# Patient Record
Sex: Male | Born: 1970 | Marital: Married | Smoking: Former smoker
Health system: Southern US, Community
[De-identification: ages and names within clinical notes are randomized; demographics above are authoritative.]

## PROBLEM LIST (undated history)

## (undated) DIAGNOSIS — F419 Anxiety disorder, unspecified: Secondary | ICD-10-CM

## (undated) DIAGNOSIS — E119 Type 2 diabetes mellitus without complications: Secondary | ICD-10-CM

## (undated) DIAGNOSIS — E785 Hyperlipidemia, unspecified: Secondary | ICD-10-CM

## (undated) HISTORY — DX: Anxiety disorder, unspecified: F41.9

## (undated) HISTORY — PX: TONSILLECTOMY: SUR1361

## (undated) HISTORY — DX: Hyperlipidemia, unspecified: E78.5

---

## 2016-11-12 ENCOUNTER — Ambulatory Visit (INDEPENDENT_AMBULATORY_CARE_PROVIDER_SITE_OTHER): Payer: BLUE CROSS/BLUE SHIELD | Admitting: Podiatry

## 2016-11-12 DIAGNOSIS — L03032 Cellulitis of left toe: Secondary | ICD-10-CM | POA: Diagnosis not present

## 2016-11-13 NOTE — Progress Notes (Signed)
Subjective:    Patient ID: Harry Murphy, male   DOB: 46 y.o.   MRN: [id number]   HPI patient presents with inflamed left big toe and states she's had problems with this over time but the worst has been over the last month    Review of Systems  All other systems reviewed and are negative.       Objective:  Physical Exam  Constitutional: He appears well-developed and well-nourished.  Cardiovascular: Intact distal pulses.   Musculoskeletal: Normal range of motion.  Neurological: He is alert.  Skin: Skin is warm.  Nursing note and vitals reviewed.  neurovascular status found to be intact muscle strength was adequate range of motion within normal limits with patient's left hallux noted to have an inflamed area lateral border that's painful when pressed making shoe gear difficult. Patient is distal redness no active drainage and no proximal edema erythema or drainage noted     Assessment:    Paronychia infection of the left hallux     Plan:    H&P condition reviewed and recommended that we removed the offending corner and allowed drainage to occur. I explained procedure and infiltrated left hallux 60 minutes I can Marcaine mixture remove the lateral border removed all proud flesh abscess tissue allowed channel for drainage and applied sterile dressing. Reappoint to recheck as needed and may require permanent procedure

## 2017-09-04 DIAGNOSIS — Z0000 Encounter for general adult medical examination without abnormal findings: Secondary | ICD-10-CM | POA: Diagnosis not present

## 2017-09-04 DIAGNOSIS — Z125 Encounter for screening for malignant neoplasm of prostate: Secondary | ICD-10-CM | POA: Diagnosis not present

## 2017-09-04 DIAGNOSIS — E7800 Pure hypercholesterolemia, unspecified: Secondary | ICD-10-CM | POA: Diagnosis not present

## 2017-09-04 DIAGNOSIS — R739 Hyperglycemia, unspecified: Secondary | ICD-10-CM | POA: Diagnosis not present

## 2017-09-11 DIAGNOSIS — E7800 Pure hypercholesterolemia, unspecified: Secondary | ICD-10-CM | POA: Diagnosis not present

## 2017-09-11 DIAGNOSIS — R739 Hyperglycemia, unspecified: Secondary | ICD-10-CM | POA: Diagnosis not present

## 2017-11-30 DIAGNOSIS — E7800 Pure hypercholesterolemia, unspecified: Secondary | ICD-10-CM | POA: Diagnosis not present

## 2017-11-30 DIAGNOSIS — S60562A Insect bite (nonvenomous) of left hand, initial encounter: Secondary | ICD-10-CM | POA: Diagnosis not present

## 2017-11-30 DIAGNOSIS — R739 Hyperglycemia, unspecified: Secondary | ICD-10-CM | POA: Diagnosis not present

## 2018-04-28 DIAGNOSIS — E119 Type 2 diabetes mellitus without complications: Secondary | ICD-10-CM | POA: Diagnosis not present

## 2018-09-15 DIAGNOSIS — E119 Type 2 diabetes mellitus without complications: Secondary | ICD-10-CM | POA: Diagnosis not present

## 2018-09-24 DIAGNOSIS — E7800 Pure hypercholesterolemia, unspecified: Secondary | ICD-10-CM | POA: Diagnosis not present

## 2018-09-24 DIAGNOSIS — Z7189 Other specified counseling: Secondary | ICD-10-CM | POA: Diagnosis not present

## 2018-09-24 DIAGNOSIS — E119 Type 2 diabetes mellitus without complications: Secondary | ICD-10-CM | POA: Diagnosis not present

## 2019-01-06 DIAGNOSIS — R739 Hyperglycemia, unspecified: Secondary | ICD-10-CM | POA: Diagnosis not present

## 2019-01-06 DIAGNOSIS — E7800 Pure hypercholesterolemia, unspecified: Secondary | ICD-10-CM | POA: Diagnosis not present

## 2019-01-06 DIAGNOSIS — Z125 Encounter for screening for malignant neoplasm of prostate: Secondary | ICD-10-CM | POA: Diagnosis not present

## 2019-01-06 DIAGNOSIS — E119 Type 2 diabetes mellitus without complications: Secondary | ICD-10-CM | POA: Diagnosis not present

## 2019-01-14 DIAGNOSIS — Z23 Encounter for immunization: Secondary | ICD-10-CM | POA: Diagnosis not present

## 2019-01-14 DIAGNOSIS — E119 Type 2 diabetes mellitus without complications: Secondary | ICD-10-CM | POA: Diagnosis not present

## 2019-01-14 DIAGNOSIS — Z0000 Encounter for general adult medical examination without abnormal findings: Secondary | ICD-10-CM | POA: Diagnosis not present

## 2019-01-14 DIAGNOSIS — E7800 Pure hypercholesterolemia, unspecified: Secondary | ICD-10-CM | POA: Diagnosis not present

## 2019-05-23 ENCOUNTER — Ambulatory Visit (INDEPENDENT_AMBULATORY_CARE_PROVIDER_SITE_OTHER): Payer: 59

## 2019-05-23 ENCOUNTER — Other Ambulatory Visit: Payer: Self-pay

## 2019-05-23 ENCOUNTER — Ambulatory Visit
Admission: EM | Admit: 2019-05-23 | Discharge: 2019-05-23 | Disposition: A | Discharge status: Home / Self Care | Payer: 59 | Attending: Family Medicine | Admitting: Family Medicine

## 2019-05-23 DIAGNOSIS — M79645 Pain in left finger(s): Secondary | ICD-10-CM | POA: Diagnosis not present

## 2019-05-23 DIAGNOSIS — S61211A Laceration without foreign body of left index finger without damage to nail, initial encounter: Secondary | ICD-10-CM

## 2019-05-23 DIAGNOSIS — W260XXA Contact with knife, initial encounter: Secondary | ICD-10-CM

## 2019-05-23 DIAGNOSIS — Z23 Encounter for immunization: Secondary | ICD-10-CM

## 2019-05-23 HISTORY — DX: Type 2 diabetes mellitus without complications: E11.9

## 2019-05-23 MED ORDER — LIDOCAINE-EPINEPHRINE-TETRACAINE (LET) TOPICAL GEL
3.0000 mL | Freq: Once | TOPICAL | Status: AC
  Administered 2019-05-23: 3 mL via TOPICAL

## 2019-05-23 MED ORDER — TETANUS-DIPHTH-ACELL PERTUSSIS 5-2.5-18.5 LF-MCG/0.5 IM SUSP
0.5000 mL | Freq: Once | INTRAMUSCULAR | Status: AC
  Administered 2019-05-23: 0.5 mL via INTRAMUSCULAR

## 2019-05-23 MED ORDER — AMOXICILLIN 500 MG PO CAPS: 500.0000 mg | ORAL_CAPSULE | Freq: Three times a day (TID) | ORAL | 0 refills | Status: DC

## 2019-05-23 NOTE — ED Triage Notes (Signed)
Pt was trying to cut tape with a hunting knife and it went into his left hand index finger

## 2019-05-23 NOTE — Discharge Instructions (Signed)
Routine wound care Follow up in 7-10 days for suture removal or sooner if any problems

## 2019-05-23 NOTE — ED Provider Notes (Signed)
[facility]    CSN: [id number] Arrival date & time: 05/23/19  1745      History   Chief Complaint Chief Complaint  Patient presents with  . Extremity Laceration    HPI Harry Murphy is a 49 y.o. male.   49 yo male with a c/o laceration to his left index finger about an hour ago with a hunting knife while trying to cut some tape.      Past Medical History:  Diagnosis Date  . Diabetes mellitus without complication ([place])     There are no problems to display for this patient.   Past Surgical History:  Procedure Laterality Date  . TONSILLECTOMY         Home Medications    Prior to Admission medications   Medication Sig Start Date End Date Taking? Authorizing Provider  amoxicillin (AMOXIL) 500 MG capsule Take 1 capsule (500 mg total) by mouth 3 (three) times daily. 05/23/19   Norval Gable, MD  DULoxetine (CYMBALTA) 60 MG capsule Take 60 mg by mouth daily. 02/23/19   [provider]  JANUVIA 100 MG tablet Take 100 mg by mouth daily. 02/10/19   [provider]  metFORMIN (GLUCOPHAGE-XR) 500 MG 24 hr tablet Take 1,000 mg by mouth 2 (two) times daily. 02/03/19   [provider]  OZEMPIC, 0.25 OR 0.5 MG/DOSE, 2 MG/1.5ML SOPN  04/29/19   [provider]  pioglitazone (ACTOS) 15 MG tablet Take 15 mg by mouth daily. 04/06/19   [provider]  propranolol (INDERAL) 10 MG tablet  04/12/19   [provider]  simvastatin (ZOCOR) 5 MG tablet Take 5 mg by mouth at bedtime. 04/29/19   [provider]    Family History History reviewed. No pertinent family history.  Social History Social History   Tobacco Use  . Smoking status: Never Smoker  . Smokeless tobacco: Former Network engineer Use Topics  . Alcohol use: Yes    Comment: social  . Drug use: Yes    Types: Marijuana     Allergies   Patient has no known allergies.   Review of Systems Review of Systems   Physical Exam Triage Vital  Signs ED Triage Vitals  Enc Vitals Group     BP 05/23/19 1758 120/74     Pulse Rate 05/23/19 1758 64     Resp 05/23/19 1758 18     Temp 05/23/19 1758 98 F (36.7 C)     Temp Source 05/23/19 1758 Oral     SpO2 05/23/19 1758 99 %     Weight 05/23/19 1757 285 lb (129.3 kg)     Height 05/23/19 1757 5\' 10"  (1.778 m)     Head Circumference --      Peak Flow --      Pain Score 05/23/19 1757 0     Pain Loc --      Pain Edu? --      Excl. in [place]? --    No data found.  Updated Vital Signs BP 120/74 (BP Location: Right Arm)   Pulse 64   Temp 98 F (36.7 C) (Oral)   Resp 18   Ht 5\' 10"  (1.778 m)   Wt 129.3 kg   SpO2 99%   BMI 40.89 kg/m   Visual Acuity Right Eye Distance:   Left Eye Distance:   Bilateral Distance:    Right Eye Near:   Left Eye Near:    Bilateral Near:     Physical Exam  Vitals and nursing note reviewed.  Constitutional:      General: He is not in acute distress.    Appearance: He is not toxic-appearing or diaphoretic.  Musculoskeletal:     Left hand: Laceration (2cm proximal lateral aspect; normal range of motion and strength; no apparent tendon involvement) and tenderness present. No swelling or deformity. Normal range of motion. Normal strength. Normal sensation. There is no disruption of two-point discrimination. Normal capillary refill. Normal pulse.  Neurological:     Mental Status: He is alert.      UC Treatments / Results  Labs (all labs ordered are listed, but only abnormal results are displayed) Labs Reviewed - No data to display  EKG   Radiology DG Finger Index Left  Result Date: 05/23/2019 CLINICAL DATA:  Laceration left index finger with a knife while trying to cut tape today. Initial encounter. EXAM: LEFT INDEX FINGER 2+V COMPARISON:  None. FINDINGS: There is no evidence of fracture or dislocation. There is no evidence of arthropathy or other focal bone abnormality. Soft tissues are unremarkable. No radiopaque foreign body. Bandaging  about the proximal aspect of the index finger noted. IMPRESSION: Negative for fracture or foreign body. Electronically Signed   By: Inge Rise M.D.   On: 05/23/2019 18:57    Procedures Laceration Repair  Date/Time: 05/23/2019 8:18 PM Performed by: Norval Gable, MD Authorized by: Norval Gable, MD   Consent:    Consent obtained:  Verbal   Consent given by:  Patient   Risks discussed:  Infection, need for additional repair, nerve damage, poor wound healing, poor cosmetic result, pain, retained foreign body, tendon damage and vascular damage   Alternatives discussed:  No treatment Anesthesia (see MAR for exact dosages):    Anesthesia method:  Topical application and local infiltration   Topical anesthetic:  LET   Local anesthetic:  Lidocaine 1% w/o epi Laceration details:    Location:  Finger   Finger location:  L index finger   Length (cm):  2 Repair type:    Repair type:  Simple Pre-procedure details:    Preparation:  Patient was prepped and draped in usual sterile fashion and imaging obtained to evaluate for foreign bodies Exploration:    Hemostasis achieved with:  Direct pressure and LET   Wound exploration: wound explored through full range of motion and entire depth of wound probed and visualized     Wound extent: areolar tissue violated     Contaminated: no   Treatment:    Area cleansed with:  Hibiclens and Betadine   Amount of cleaning:  Standard   Irrigation solution:  Sterile water   Irrigation method:  Syringe   Foreign body removal: no visualized foreign bodies.   Skin repair:    Repair method:  Sutures and Steri-Strips   Suture size:  5-0   Suture material:  Nylon   Suture technique:  Simple interrupted   Number of sutures:  6   Number of Steri-Strips:  3 Approximation:    Approximation:  Close Post-procedure details:    Dressing:  Antibiotic ointment, non-adherent dressing and splint for protection   Patient tolerance of procedure:  Tolerated well,  no immediate complications   (including critical care time)  Medications Ordered in UC Medications  Tdap (BOOSTRIX) injection 0.5 mL (0.5 mLs Intramuscular Given 05/23/19 1805)  lidocaine-EPINEPHrine-tetracaine (LET) topical gel (3 mLs Topical Given 05/23/19 1817)    Initial Impression / Assessment and Plan / UC Course  I have reviewed the triage vital  signs and the nursing notes.  Pertinent labs & imaging results that were available during my care of the patient were reviewed by me and considered in my medical decision making (see chart for details).     Final Clinical Impressions(s) / UC Diagnoses   Final diagnoses:  Laceration of left index finger without foreign body without damage to nail, initial encounter     Discharge Instructions     Routine wound care Follow up in 7-10 days for suture removal or sooner if any problems    ED Prescriptions    Medication Sig Dispense Auth. Provider   amoxicillin (AMOXIL) 500 MG capsule Take 1 capsule (500 mg total) by mouth 3 (three) times daily. 21 capsule Norval Gable, MD      1. x-ray results and diagnosis reviewed with patient; Tdap given 2. rx as per orders above; reviewed possible side effects, interactions, risks and benefits; prophylactic rx due to diabetes (risk) 3. Recommend supportive treatment with routine wound care; information 4. Follow-up in 7-10 days for suture removal or sooner prn if any problems   PDMP not reviewed this encounter.   Norval Gable, MD 05/23/19 2025

## 2019-06-01 ENCOUNTER — Ambulatory Visit
Admission: EM | Admit: 2019-06-01 | Discharge: 2019-06-01 | Disposition: A | Discharge status: Home / Self Care | Payer: 59

## 2019-06-01 DIAGNOSIS — Z4802 Encounter for removal of sutures: Secondary | ICD-10-CM

## 2019-06-01 NOTE — ED Triage Notes (Signed)
Patient here for suture removal. Had 6 sutures placed to left pointer finger.

## 2019-10-21 ENCOUNTER — Other Ambulatory Visit: Payer: Self-pay

## 2019-10-21 ENCOUNTER — Ambulatory Visit (AMBULATORY_SURGERY_CENTER): Payer: Self-pay | Admitting: *Deleted

## 2019-10-21 ENCOUNTER — Encounter: Payer: Self-pay | Admitting: Gastroenterology

## 2019-10-21 VITALS — Ht 70.0 in | Wt 284.0 lb

## 2019-10-21 DIAGNOSIS — Z1211 Encounter for screening for malignant neoplasm of colon: Secondary | ICD-10-CM

## 2019-10-21 MED ORDER — SUTAB 1479-225-188 MG PO TABS: 1.0000 | ORAL_TABLET | ORAL | 0 refills | Status: DC

## 2019-10-21 NOTE — Progress Notes (Signed)
Patient is here in-person for PV. Patient denies any allergies to eggs or soy. Patient denies any problems with anesthesia/sedation. Patient denies any oxygen use at home. Patient denies taking any diet/weight loss medications or blood thinners. Patient is not being treated for MRSA or C-diff. Patient is aware of our care-partner policy and FUWTK-18 safety protocol.COVID-19 vaccines completed on 07/27/19 per pt. Prep Prescription coupon given to the patient.

## 2019-11-04 ENCOUNTER — Ambulatory Visit (AMBULATORY_SURGERY_CENTER): Payer: No Typology Code available for payment source | Admitting: Gastroenterology

## 2019-11-04 ENCOUNTER — Encounter: Payer: Self-pay | Admitting: Gastroenterology

## 2019-11-04 ENCOUNTER — Other Ambulatory Visit: Payer: Self-pay

## 2019-11-04 VITALS — BP 115/70 | HR 58 | Temp 97.7°F | Resp 12 | Ht 70.0 in | Wt 284.0 lb

## 2019-11-04 DIAGNOSIS — D123 Benign neoplasm of transverse colon: Secondary | ICD-10-CM

## 2019-11-04 DIAGNOSIS — D122 Benign neoplasm of ascending colon: Secondary | ICD-10-CM

## 2019-11-04 DIAGNOSIS — Z1211 Encounter for screening for malignant neoplasm of colon: Secondary | ICD-10-CM

## 2019-11-04 MED ORDER — SODIUM CHLORIDE 0.9 % IV SOLN: 500.0000 mL | Freq: Once | INTRAVENOUS | Status: DC

## 2019-11-04 NOTE — Patient Instructions (Signed)

## 2019-11-04 NOTE — Progress Notes (Signed)
Called to room to assist during endoscopic procedure.  Patient ID and intended procedure confirmed with present staff. Received instructions for my participation in the procedure from the performing physician.  

## 2019-11-04 NOTE — Progress Notes (Signed)
Report to PACU, RN, vss, BBS= Clear.  

## 2019-11-04 NOTE — Progress Notes (Signed)
Pt's states no medical or surgical changes since previsit or office visit. VS by CW. 

## 2019-11-04 NOTE — Op Note (Signed)
[place] Patient Name: Harry Murphy Procedure Date: 11/04/2019 9:14 AM MRN: [id number] Endoscopist: Ladene Artist , MD Age: 49 Referring MD:  Date of Birth: 06/20/1970 Gender: Male Account #: [id number] Procedure:                Colonoscopy Indications:              Screening for colorectal malignant neoplasm Medicines:                Monitored Anesthesia Care Procedure:                Pre-Anesthesia Assessment:                           - Prior to the procedure, a History and Physical                            was performed, and patient medications and                            allergies were reviewed. The patient's tolerance of                            previous anesthesia was also reviewed. The risks                            and benefits of the procedure and the sedation                            options and risks were discussed with the patient.                            All questions were answered, and informed consent                            was obtained. Prior Anticoagulants: The patient has                            taken no previous anticoagulant or antiplatelet                            agents. ASA Grade Assessment: III - A patient with                            severe systemic disease. After reviewing the risks                            and benefits, the patient was deemed in                            satisfactory condition to undergo the procedure.                           After obtaining informed consent, the colonoscope  was passed under direct vision. Throughout the                            procedure, the patient's blood pressure, pulse, and                            oxygen saturations were monitored continuously. The                            Colonoscope was introduced through the anus and                            advanced to the the cecum, identified by                            appendiceal orifice and  ileocecal valve. The                            ileocecal valve, appendiceal orifice, and rectum                            were photographed. The quality of the bowel                            preparation was good. The colonoscopy was performed                            without difficulty. The patient tolerated the                            procedure well. Scope In: 9:21:15 AM Scope Out: 9:37:37 AM Scope Withdrawal Time: 0 hours 12 minutes 47 seconds  Total Procedure Duration: 0 hours 16 minutes 22 seconds  Findings:                 The perianal and digital rectal examinations were                            normal.                           Two sessile polyps were found in the transverse                            colon and ascending colon. The polyps were 6 to 8                            mm in size. These polyps were removed with a cold                            snare. Resection and retrieval were complete.                           A few small-mouthed diverticula were found in the  left colon.                           Internal hemorrhoids were found during                            retroflexion. The hemorrhoids were small and Grade                            I (internal hemorrhoids that do not prolapse).                           The exam was otherwise without abnormality on                            direct and retroflexion views. Complications:            No immediate complications. Estimated blood loss:                            None. Estimated Blood Loss:     Estimated blood loss: none. Impression:               - Two 6 to 8 mm polyps in the transverse colon and                            in the ascending colon, removed with a cold snare.                            Resected and retrieved.                           - Mild diverticulosis in the left colon.                           - Internal hemorrhoids.                           - The  examination was otherwise normal on direct                            and retroflexion views. Recommendation:           - Repeat colonoscopy date to be determined after                            pending pathology results are reviewed for                            surveillance based on pathology results.                           - Patient has a contact number available for                            emergencies. The signs and symptoms of potential  delayed complications were discussed with the                            patient. Return to normal activities tomorrow.                            Written discharge instructions were provided to the                            patient.                           - High fiber diet.                           - Continue present medications.                           - Await pathology results. Ladene Artist, MD 11/04/2019 9:42:27 AM This report has been signed electronically.

## 2019-11-08 ENCOUNTER — Telehealth: Payer: Self-pay | Admitting: *Deleted

## 2019-11-08 ENCOUNTER — Telehealth: Payer: Self-pay

## 2019-11-08 NOTE — Telephone Encounter (Signed)
  Follow up Call-  Call back number 11/04/2019  Post procedure Call Back phone  # [phone]  Permission to leave phone message Yes  Some recent data might be hidden     No answer at 2nd attempt follow up phone call.  Left message on voicemail.

## 2019-11-08 NOTE — Telephone Encounter (Signed)
LVM

## 2019-11-13 ENCOUNTER — Encounter: Payer: Self-pay | Admitting: Gastroenterology

## 2020-07-17 IMAGING — CR DG FINGER INDEX 2+V*L*
3 series · 4 of 4 positions shown · non-contrast
Comparison: None.

CLINICAL DATA: Laceration left index finger with a knife while
trying to cut tape today. Initial encounter.

EXAM:
LEFT INDEX FINGER 2+V

[finger ap]
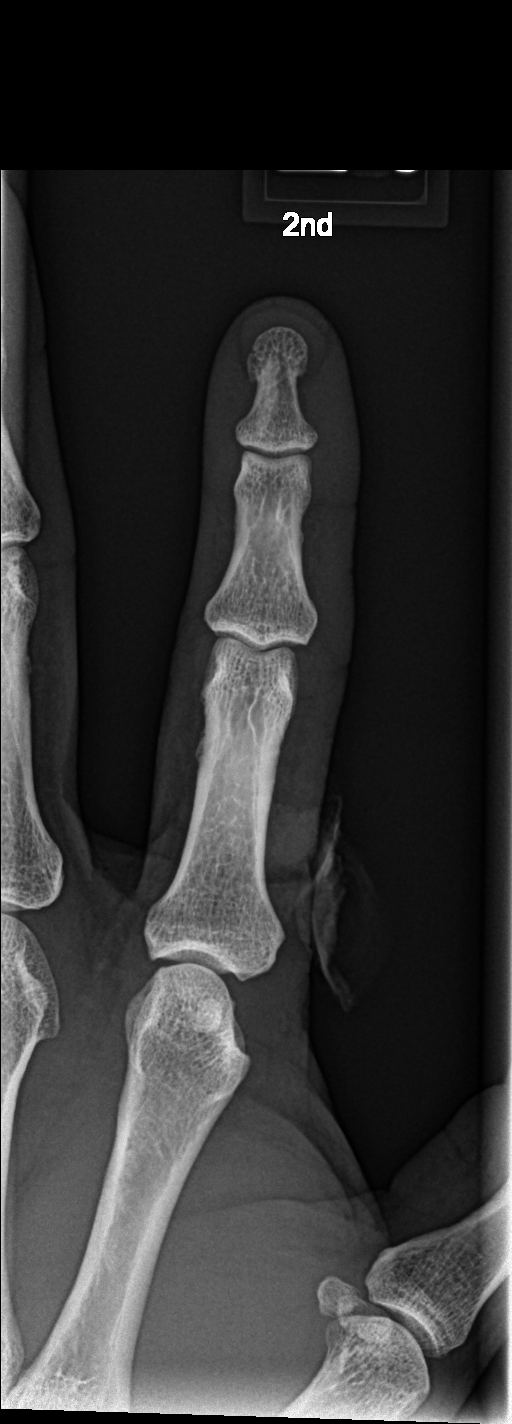

[finger obl]
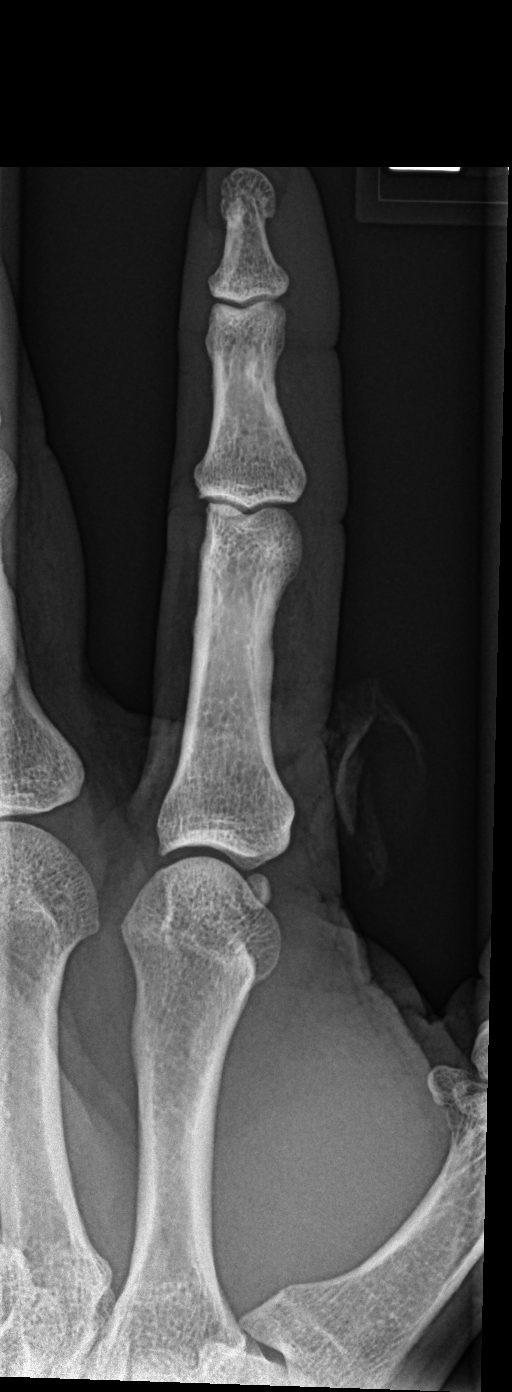

[Series 3: finger lat · 0.14mm/px · 2 of 2 slices shown]
[im 1/2]
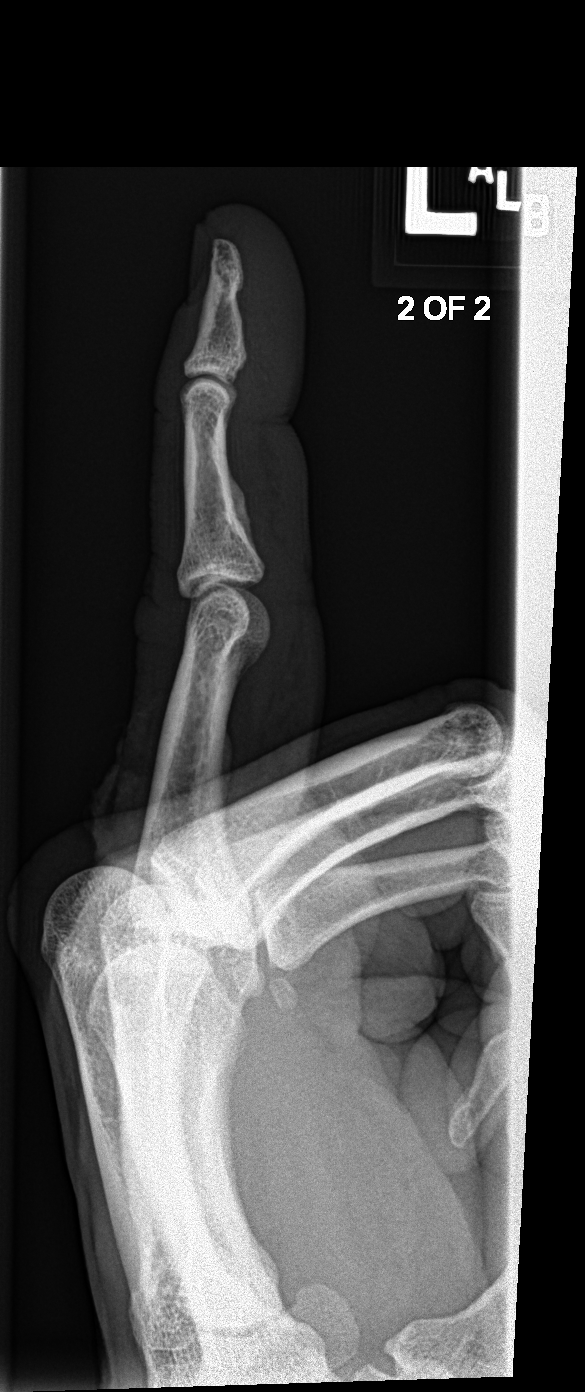
[im 2/2]
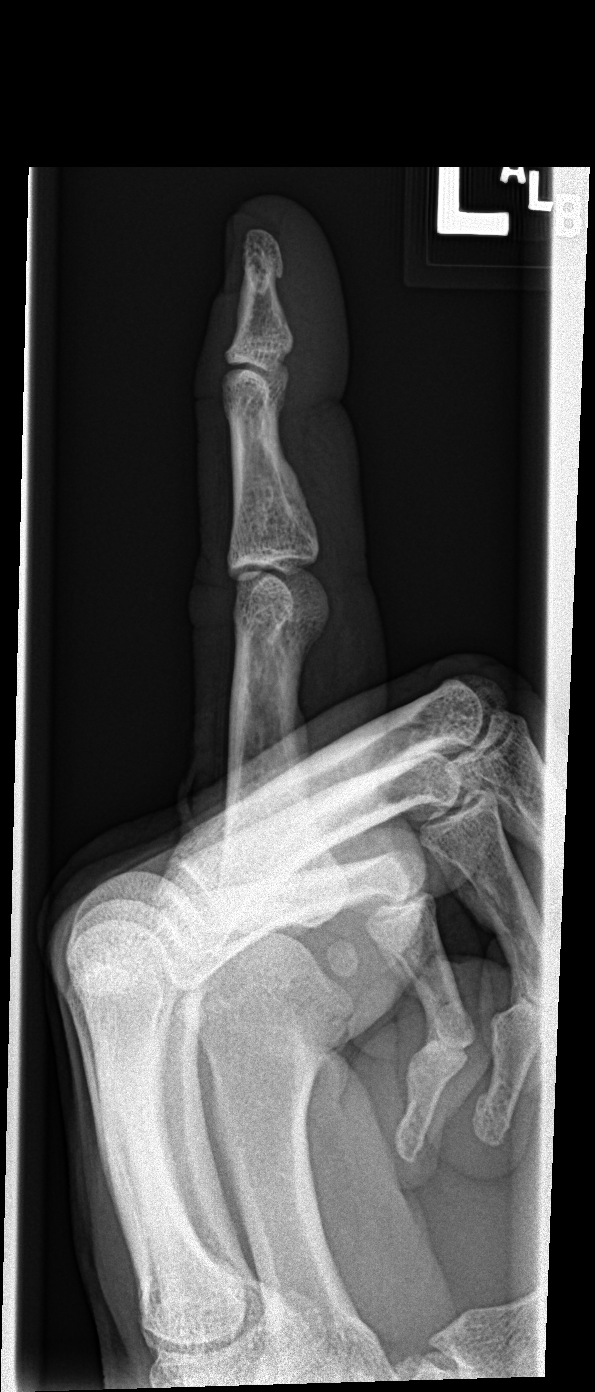

[4 of 4 positions shown; findings below may reference images not displayed]

FINDINGS: There is no evidence of fracture or dislocation. There is no
evidence of arthropathy or other focal bone abnormality. Soft
tissues are unremarkable. No radiopaque foreign body. Bandaging
about the proximal aspect of the index finger noted.
IMPRESSION: Negative for fracture or foreign body.

## 2021-03-06 ENCOUNTER — Other Ambulatory Visit (HOSPITAL_COMMUNITY): Payer: Self-pay

## 2021-03-06 MED ORDER — OZEMPIC (0.25 OR 0.5 MG/DOSE) 2 MG/1.5ML ~~LOC~~ SOPN
PEN_INJECTOR | SUBCUTANEOUS | 5 refills | Status: DC
  Filled 2021-03-06 – 2021-03-08 (×2): qty 1.5, 28d supply, fill #0
  Filled 2021-04-02: qty 1.5, 28d supply, fill #1

## 2021-03-08 ENCOUNTER — Other Ambulatory Visit (HOSPITAL_COMMUNITY): Payer: Self-pay

## 2021-04-02 ENCOUNTER — Other Ambulatory Visit (HOSPITAL_COMMUNITY): Payer: Self-pay

## 2022-01-01 ENCOUNTER — Other Ambulatory Visit (HOSPITAL_COMMUNITY): Payer: Self-pay

## 2022-04-30 ENCOUNTER — Encounter: Payer: Self-pay | Admitting: Internal Medicine

## 2022-04-30 ENCOUNTER — Ambulatory Visit: Payer: No Typology Code available for payment source | Admitting: Internal Medicine

## 2022-04-30 VITALS — BP 143/89 | HR 67 | Ht 70.0 in | Wt 270.6 lb

## 2022-04-30 DIAGNOSIS — R002 Palpitations: Secondary | ICD-10-CM

## 2022-04-30 DIAGNOSIS — I471 Supraventricular tachycardia, unspecified: Secondary | ICD-10-CM

## 2022-04-30 DIAGNOSIS — I10 Essential (primary) hypertension: Secondary | ICD-10-CM

## 2022-04-30 DIAGNOSIS — E782 Mixed hyperlipidemia: Secondary | ICD-10-CM

## 2022-04-30 MED ORDER — PROPRANOLOL HCL 10 MG PO TABS: 10.0000 mg | ORAL_TABLET | Freq: Three times a day (TID) | ORAL | 6 refills | Status: AC | PRN

## 2022-04-30 MED ORDER — LISINOPRIL 5 MG PO TABS: 5.0000 mg | ORAL_TABLET | Freq: Every day | ORAL | 3 refills | Status: AC

## 2022-04-30 NOTE — Progress Notes (Signed)
Primary Physician/Referring:  Harry Seashore, MD  Patient ID: Harry Murphy, male    DOB: 17-Sep-1970, 52 y.o.   MRN: [id number]  Chief Complaint  Patient presents with   Palpitations   New Patient (Initial Visit)        HPI:    Harry Murphy  is a 52 y.o. male with past medical history significant for hypertension, hyperlipidemia, and diabetes who is here to establish care with cardiology due to palpitations.  Patient saw his primary care physician a few months ago and mentioned he was having frequent palpitations and at that time an event monitor was ordered.  He did follow back up with his PCP who told him it was no significant finding on his event monitor.  Patient does still experience palpitations.  Of note when he exercises and even at max exertion he does not have any chest pain or shortness of breath.  Patient is fairly active and he also never experiences palpitations with activity.  Usually his palpitations just come on randomly but the propranolol has been helping.  Patient denies diaphoresis, syncope, edema, orthopnea, PND, claudication.  Past Medical History:  Diagnosis Date   Anxiety    Diabetes mellitus without complication ([place])    Type 2   Hyperlipidemia    Past Surgical History:  Procedure Laterality Date   TONSILLECTOMY     Family History  Problem Relation Age of Onset   Diabetes type I Mother    Stroke Father    Diabetes Mellitus II Father    Stroke Sister    Pancreatic cancer Maternal Grandmother    Colon cancer Neg Hx    Colon polyps Neg Hx    Esophageal cancer Neg Hx    Rectal cancer Neg Hx    Stomach cancer Neg Hx     Social History   Tobacco Use   Smoking status: Former    Packs/day: 1.00    Years: 10.00    Total pack years: 10.00    Types: Cigarettes    Quit date: 1998    Years since quitting: 26.0   Smokeless tobacco: Former    Quit date: 2010  Substance Use Topics   Alcohol use: Yes    Alcohol/week: 3.0 standard drinks of alcohol     Types: 3 Standard drinks or equivalent per week    Comment: social-occ   Marital Status: Married  ROS  Review of Systems  Cardiovascular:  Positive for palpitations.   Objective  Blood pressure (!) 143/89, pulse 67, height '5\' 10"'$  (1.778 m), weight 270 lb 9.6 oz (122.7 kg), SpO2 100 %. Body mass index is 38.83 kg/m.     04/30/2022    3:08 PM 11/04/2019   10:00 AM 11/04/2019    9:57 AM  Vitals with BMI  Height '5\' 10"'$     Weight 270 lbs 10 oz    BMI 83.66    Systolic 294 765 465  Diastolic 89 70 70  Pulse 67 58      Physical Exam Vitals reviewed.  HENT:     Head: Normocephalic and atraumatic.  Cardiovascular:     Rate and Rhythm: Normal rate and regular rhythm.     Pulses: Normal pulses.     Heart sounds: Normal heart sounds. No murmur heard. Pulmonary:     Effort: Pulmonary effort is normal.     Breath sounds: Normal breath sounds.  Abdominal:     General: Bowel sounds are normal.  Musculoskeletal:     Right lower  leg: No edema.     Left lower leg: No edema.  Skin:    General: Skin is warm and dry.  Neurological:     Mental Status: He is alert.     Medications and allergies  No Known Allergies   Medication list after today's encounter   Current Outpatient Medications:    DULoxetine (CYMBALTA) 60 MG capsule, Take 60 mg by mouth daily., Disp: , Rfl:    lisinopril (ZESTRIL) 5 MG tablet, Take 1 tablet (5 mg total) by mouth at bedtime., Disp: 90 tablet, Rfl: 3   OZEMPIC, 1 MG/DOSE, 4 MG/3ML SOPN, Inject into the skin., Disp: , Rfl:    propranolol (INDERAL) 10 MG tablet, Take 1 tablet (10 mg total) by mouth 3 (three) times daily as needed., Disp: 90 tablet, Rfl: 6   rosuvastatin (CRESTOR) 10 MG tablet, Take 10 mg by mouth daily., Disp: , Rfl:    XIGDUO XR 08-998 MG TB24, Take 1 tablet by mouth daily., Disp: , Rfl:   Laboratory examination:   No results found for: "NA", "K", "CO2", "GLUCOSE", "BUN", "CREATININE", "CALCIUM", "EGFR", "GFRNONAA"      No data to  display             No data to display          Lipid Panel No results for input(s): "CHOL", "TRIG", "LDLCALC", "VLDL", "HDL", "CHOLHDL", "LDLDIRECT" in the last 8760 hours.  HEMOGLOBIN A1C No results found for: "HGBA1C", "MPG" TSH No results for input(s): "TSH" in the last 8760 hours.  External labs:   Labs 12/10/2021: Hemoglobin 16.1 hematocrit 48.4 platelets 208 Glucose 150 BUN 15 creatinine 0.96 potassium 5.1 AST 56 ALT 85 Hemoglobin A1c 7.4 PSA 0.4 TSH 1.32 Total cholesterol 127 triglycerides 145 HDL 43 LDL 59  Radiology:    Cardiac Studies:   Zio patch worn for 14 days from 12/25/2021 to 01/08/2022 Predominant rhythm was normal sinus rhythm Max heart rate 172 minimum 46 and average heart rate of 71 Very few runs of SVT noted, less than 1%  EKG:   04/30/2022: Normal sinus rhythm, normal axis, normal R wave progression.  No ischemia  Assessment     ICD-10-CM   1. Palpitations  R00.2 EKG 12-Lead    PCV ECHOCARDIOGRAM COMPLETE    Basic metabolic panel    CBC    Hgb A1c w/o eAG    Lipid Panel With LDL/HDL Ratio    PSA    TSH    VITAMIN D 25 Hydroxy (Vit-D Deficiency, Fractures)    2. SVT (supraventricular tachycardia)  I47.10 PCV ECHOCARDIOGRAM COMPLETE    Basic metabolic panel    CBC    Hgb A1c w/o eAG    Lipid Panel With LDL/HDL Ratio    PSA    TSH    VITAMIN D 25 Hydroxy (Vit-D Deficiency, Fractures)    3. Essential hypertension  I10 PCV ECHOCARDIOGRAM COMPLETE    Basic metabolic panel    CBC    Hgb A1c w/o eAG    Lipid Panel With LDL/HDL Ratio    PSA    TSH    VITAMIN D 25 Hydroxy (Vit-D Deficiency, Fractures)    4. Mixed hyperlipidemia  E78.2 PCV ECHOCARDIOGRAM COMPLETE    Basic metabolic panel    CBC    Hgb A1c w/o eAG    Lipid Panel With LDL/HDL Ratio    PSA    TSH    VITAMIN D 25 Hydroxy (Vit-D Deficiency, Fractures)  Orders Placed This Encounter  Procedures   Basic metabolic panel   CBC   Hgb A1c w/o eAG   Lipid Panel  With LDL/HDL Ratio   PSA   TSH   VITAMIN D 25 Hydroxy (Vit-D Deficiency, Fractures)   EKG 12-Lead   PCV ECHOCARDIOGRAM COMPLETE    Standing Status:   Future    Standing Expiration Date:   05/01/2023    Meds ordered this encounter  Medications   propranolol (INDERAL) 10 MG tablet    Sig: Take 1 tablet (10 mg total) by mouth 3 (three) times daily as needed.    Dispense:  90 tablet    Refill:  6   lisinopril (ZESTRIL) 5 MG tablet    Sig: Take 1 tablet (5 mg total) by mouth at bedtime.    Dispense:  90 tablet    Refill:  3    Medications Discontinued During This Encounter  Medication Reason   OZEMPIC, 0.25 OR 0.5 MG/DOSE, 2 MG/1.5ML SOPN Completed Course   pioglitazone (ACTOS) 15 MG tablet Completed Course   Semaglutide,0.25 or 0.'5MG'$ /DOS, (OZEMPIC, 0.25 OR 0.5 MG/DOSE,) 2 MG/1.5ML SOPN Completed Course   simvastatin (ZOCOR) 5 MG tablet Completed Course   metFORMIN (GLUCOPHAGE-XR) 500 MG 24 hr tablet Change in therapy   propranolol (INDERAL) 10 MG tablet      Recommendations:   Harry Murphy is a 52 y.o.  male with palpitations  Palpitations Will increase propranolol 10 mg to 3 times daily as needed   SVT (supraventricular tachycardia) Event monitor worn, see results above Echocardiogram ordered   Essential hypertension Will send lisinopril 5 mg to be taken nightly I would like repeat BMP in approximately 1 week however patient has follow-up with his primary care doctor first week of March and he will be getting repeat labs then.  I will go ahead and order all of the repeat labs for him. Encourage low-sodium diet, less than 2000 mg daily. Follow-up in 1-2 months or sooner if needed.   Mixed hyperlipidemia Continue Crestor, lipids are very well-controlled     Harry Flock, DO, [facility]  04/30/2022, 4:22 PM Office: [phone] Pager: [phone]

## 2022-05-07 ENCOUNTER — Ambulatory Visit: Payer: No Typology Code available for payment source

## 2022-05-07 DIAGNOSIS — I10 Essential (primary) hypertension: Secondary | ICD-10-CM

## 2022-05-07 DIAGNOSIS — R002 Palpitations: Secondary | ICD-10-CM

## 2022-05-07 DIAGNOSIS — I471 Supraventricular tachycardia, unspecified: Secondary | ICD-10-CM

## 2022-05-07 DIAGNOSIS — E782 Mixed hyperlipidemia: Secondary | ICD-10-CM

## 2022-05-11 NOTE — Progress Notes (Signed)
normal

## 2022-05-12 NOTE — Progress Notes (Signed)
Called patient, NA, LMAM

## 2022-05-12 NOTE — Progress Notes (Signed)
Patient called back, I have discussed results with patient.

## 2022-06-11 ENCOUNTER — Encounter: Payer: Self-pay | Admitting: Internal Medicine

## 2022-06-18 ENCOUNTER — Encounter: Payer: Self-pay | Admitting: Internal Medicine

## 2022-06-18 ENCOUNTER — Ambulatory Visit: Payer: No Typology Code available for payment source | Admitting: Internal Medicine

## 2022-06-18 VITALS — BP 121/69 | HR 79 | Ht 70.0 in | Wt 271.0 lb

## 2022-06-18 DIAGNOSIS — R002 Palpitations: Secondary | ICD-10-CM

## 2022-06-18 DIAGNOSIS — I10 Essential (primary) hypertension: Secondary | ICD-10-CM

## 2022-06-18 DIAGNOSIS — E782 Mixed hyperlipidemia: Secondary | ICD-10-CM

## 2022-06-18 NOTE — Progress Notes (Unsigned)
Primary Physician/Referring:  Merrilee Seashore, MD  Patient ID: Harry Murphy, male    DOB: [birth year], 52 y.o.   MRN: [id number]  Chief Complaint  Patient presents with   Palpitations   Follow-up   HPI:    Harry Murphy  is a 52 y.o. male with past medical history significant for hypertension, hyperlipidemia, and diabetes who is here for follow-up visit. Patient denies diaphoresis, syncope, edema, orthopnea, PND, claudication.  Past Medical History:  Diagnosis Date   Anxiety    Diabetes mellitus without complication ([place])    Type 2   Hyperlipidemia    Past Surgical History:  Procedure Laterality Date   TONSILLECTOMY     Family History  Problem Relation Age of Onset   Diabetes type I Mother    Stroke Father    Diabetes Mellitus II Father    Stroke Sister    Pancreatic cancer Maternal Grandmother    Colon cancer Neg Hx    Colon polyps Neg Hx    Esophageal cancer Neg Hx    Rectal cancer Neg Hx    Stomach cancer Neg Hx     Social History   Tobacco Use   Smoking status: Former    Packs/day: 1.00    Years: 10.00    Total pack years: 10.00    Types: Cigarettes    Quit date: 1998    Years since quitting: 26.2   Smokeless tobacco: Former    Quit date: 2010  Substance Use Topics   Alcohol use: Yes    Alcohol/week: 3.0 standard drinks of alcohol    Types: 3 Standard drinks or equivalent per week    Comment: social-occ   Marital Status: Married  ROS  Review of Systems  Cardiovascular:  Positive for palpitations.   Objective  There were no vitals taken for this visit. There is no height or weight on file to calculate BMI.     04/30/2022    3:08 PM 11/04/2019   10:00 AM 11/04/2019    9:57 AM  Vitals with BMI  Height '5\' 10"'$     Weight 270 lbs 10 oz    BMI [id number]    Systolic [id number]  Diastolic 89 70 70  Pulse 67 58      Physical Exam Vitals reviewed.  HENT:     Head: Normocephalic and atraumatic.  Cardiovascular:     Rate and Rhythm: Normal rate and  regular rhythm.     Pulses: Normal pulses.     Heart sounds: Normal heart sounds. No murmur heard. Pulmonary:     Effort: Pulmonary effort is normal.     Breath sounds: Normal breath sounds.  Abdominal:     General: Bowel sounds are normal.  Musculoskeletal:     Right lower leg: No edema.     Left lower leg: No edema.  Skin:    General: Skin is warm and dry.  Neurological:     Mental Status: He is alert.     Medications and allergies  No Known Allergies   Medication list after today's encounter   Current Outpatient Medications:    DULoxetine (CYMBALTA) 60 MG capsule, Take 60 mg by mouth daily., Disp: , Rfl:    lisinopril (ZESTRIL) 5 MG tablet, Take 1 tablet (5 mg total) by mouth at bedtime., Disp: 90 tablet, Rfl: 3   OZEMPIC, 1 MG/DOSE, 4 MG/3ML SOPN, Inject into the skin., Disp: , Rfl:    propranolol (INDERAL) 10 MG tablet, Take 1 tablet (10 mg  total) by mouth 3 (three) times daily as needed., Disp: 90 tablet, Rfl: 6   rosuvastatin (CRESTOR) 10 MG tablet, Take 10 mg by mouth daily., Disp: , Rfl:    XIGDUO XR 08-998 MG TB24, Take 1 tablet by mouth daily., Disp: , Rfl:   Laboratory examination:   No results found for: "NA", "K", "CO2", "GLUCOSE", "BUN", "CREATININE", "CALCIUM", "EGFR", "GFRNONAA"      No data to display             No data to display          Lipid Panel No results for input(s): "CHOL", "TRIG", "LDLCALC", "VLDL", "HDL", "CHOLHDL", "LDLDIRECT" in the last 8760 hours.  HEMOGLOBIN A1C No results found for: "HGBA1C", "MPG" TSH No results for input(s): "TSH" in the last 8760 hours.  External labs:   Labs 12/10/2021: Hemoglobin 16.1 hematocrit 48.4 platelets 208 Glucose 150 BUN 15 creatinine 0.96 potassium 5.1 AST 56 ALT 85 Hemoglobin A1c 7.4 PSA 0.4 TSH 1.32 Total cholesterol 127 triglycerides 145 HDL 43 LDL 59  Radiology:    Cardiac Studies:   Zio patch worn for 14 days from 12/25/2021 to 01/08/2022 Predominant rhythm was normal sinus  rhythm Max heart rate 172 minimum 46 and average heart rate of 71 Very few runs of SVT noted, less than 1%  Echocardiogram 05/07/2022:  Normal LV systolic function with visual EF 60-65%. Left ventricle cavity  is normal in size. Normal global wall motion. Normal diastolic filling  pattern, normal LAP. Mild left ventricular hypertrophy.  No significant valvular heart disease.  No prior study for comparison.    EKG:   04/30/2022: Normal sinus rhythm, normal axis, normal R wave progression.  No ischemia  Assessment     ICD-10-CM   1. Mixed hyperlipidemia  E78.2     2. Essential hypertension  I10     3. Palpitations  R00.2        No orders of the defined types were placed in this encounter.   No orders of the defined types were placed in this encounter.   There are no discontinued medications.    Recommendations:   Dameer Lamon is a 52 y.o.  male with palpitations  Palpitations Will increase propranolol 10 mg to 3 times daily as needed   SVT (supraventricular tachycardia) Event monitor worn, see results above Echocardiogram ordered   Essential hypertension Continue lisinopril 5 mg to be taken nightly Encourage low-sodium diet, less than 2000 mg daily. Follow-up in 6 months or sooner if needed.   Mixed hyperlipidemia Continue Crestor, lipids are very well-controlled     Floydene Flock, DO, [facility]  06/18/2022, 3:51 PM Office: [phone] Pager: [phone]

## 2022-09-07 ENCOUNTER — Ambulatory Visit (INDEPENDENT_AMBULATORY_CARE_PROVIDER_SITE_OTHER): Payer: No Typology Code available for payment source

## 2022-09-07 ENCOUNTER — Ambulatory Visit
Admission: RE | Admit: 2022-09-07 | Discharge: 2022-09-07 | Disposition: A | Discharge status: Home / Self Care | Payer: No Typology Code available for payment source | Source: Ambulatory Visit | Attending: Family Medicine | Admitting: Family Medicine

## 2022-09-07 VITALS — BP 128/84 | HR 80 | Temp 98.7°F | Resp 16 | Ht 70.0 in | Wt 260.0 lb

## 2022-09-07 DIAGNOSIS — H6993 Unspecified Eustachian tube disorder, bilateral: Secondary | ICD-10-CM

## 2022-09-07 DIAGNOSIS — J329 Chronic sinusitis, unspecified: Secondary | ICD-10-CM

## 2022-09-07 DIAGNOSIS — J069 Acute upper respiratory infection, unspecified: Secondary | ICD-10-CM

## 2022-09-07 DIAGNOSIS — Z87891 Personal history of nicotine dependence: Secondary | ICD-10-CM

## 2022-09-07 MED ORDER — IPRATROPIUM BROMIDE 0.06 % NA SOLN: 2.0000 | Freq: Four times a day (QID) | NASAL | 12 refills | Status: AC

## 2022-09-07 MED ORDER — PREDNISONE 10 MG (21) PO TBPK: ORAL_TABLET | Freq: Every day | ORAL | 0 refills | Status: AC

## 2022-09-07 NOTE — ED Provider Notes (Signed)
[facility]    CSN: [id number] Arrival date & time: 09/07/22  1018      History   Chief Complaint Chief Complaint  Patient presents with   Cough    Appt    Nasal Congestion         HPI Harry Murphy is a 52 y.o. male.   HPI  History obtained from {source of history:[id number]}. Harry Murphy presents for cough for the past week after coming back from a cruise. He went snorkeling and got water in his nose.  He would get bronchitis.    Fever : no  Chills: no Sore throat: no   Cough: no Sputum: no Chest tightness: no Shortness of breath: no Wheezing: no  Nasal congestion : no  Rhinorrhea: no Myalgias: no Appetite: normal  Hydration: normal  Abdominal pain: no Nausea: no Vomiting: no Diarrhea: No Rash: No Sleep disturbance: no Headache: no      Past Medical History:  Diagnosis Date   Anxiety    Diabetes mellitus without complication ([facility])    Type 2   Hyperlipidemia     There are no problems to display for this patient.   Past Surgical History:  Procedure Laterality Date   TONSILLECTOMY         Home Medications    Prior to Admission medications   Medication Sig Start Date End Date Taking? Authorizing Provider  DULoxetine (CYMBALTA) 60 MG capsule Take 60 mg by mouth daily. 02/23/19  Yes [provider]  lisinopril (ZESTRIL) 5 MG tablet Take 1 tablet (5 mg total) by mouth at bedtime. 04/30/22 09/07/22 Yes Custovic, Sabina, DO  OZEMPIC, 1 MG/DOSE, 4 MG/3ML SOPN Inject into the skin. 04/12/22  Yes [provider]  propranolol (INDERAL) 10 MG tablet Take 1 tablet (10 mg total) by mouth 3 (three) times daily as needed. 04/30/22  Yes Custovic, Rozell Searing, DO  rosuvastatin (CRESTOR) 10 MG tablet Take 10 mg by mouth daily. 04/30/21  Yes [provider]  XIGDUO XR 08-998 MG TB24 Take 1 tablet by mouth daily.   Yes [provider]    Family History Family History  Problem Relation Age of Onset   Diabetes type I Mother     Stroke Father    Diabetes Mellitus II Father    Stroke Sister    Pancreatic cancer Maternal Grandmother    Colon cancer Neg Hx    Colon polyps Neg Hx    Esophageal cancer Neg Hx    Rectal cancer Neg Hx    Stomach cancer Neg Hx     Social History Social History   Tobacco Use   Smoking status: Former    Packs/day: 1.00    Years: 10.00    Additional pack years: 0.00    Total pack years: 10.00    Types: Cigarettes    Quit date: 1998    Years since quitting: 26.4   Smokeless tobacco: Former    Quit date: 2010  Vaping Use   Vaping Use: Never used  Substance Use Topics   Alcohol use: Yes    Alcohol/week: 3.0 standard drinks of alcohol    Types: 3 Standard drinks or equivalent per week    Comment: social-occ   Drug use: Not Currently     Allergies   Patient has no known allergies.   Review of Systems Review of Systems: negative unless otherwise stated in HPI.      Physical Exam Triage Vital Signs ED Triage Vitals  Enc Vitals Group  BP 09/07/22 1026 128/84     Pulse Rate 09/07/22 1026 80     Resp 09/07/22 1026 16     Temp 09/07/22 1026 98.7 F (37.1 C)     Temp Source 09/07/22 1026 Oral     SpO2 09/07/22 1026 100 %     Weight 09/07/22 1026 260 lb (117.9 kg)     Height 09/07/22 1026 5\' 10"  (1.778 m)     Head Circumference --      Peak Flow --      Pain Score 09/07/22 1030 0     Pain Loc --      Pain Edu? --      Excl. in GC? --    No data found.  Updated Vital Signs BP 128/84 (BP Location: Left Arm)   Pulse 80   Temp 98.7 F (37.1 C) (Oral)   Resp 16   Ht 5\' 10"  (1.778 m)   Wt 117.9 kg   SpO2 100%   BMI 37.31 kg/m   Visual Acuity Right Eye Distance:   Left Eye Distance:   Bilateral Distance:    Right Eye Near:   Left Eye Near:    Bilateral Near:     Physical Exam GEN:     alert, non-toxic appearing male in no distress ***   HENT:  mucus membranes moist, oropharyngeal ***without lesions or ***erythema, no*** tonsillar hypertrophy or  exudates, *** moderate erythematous edematous turbinates, ***clear nasal discharge, ***bilateral TM normal EYES:   pupils equal and reactive, ***no scleral injection or discharge NECK:  normal ROM, no ***lymphadenopathy, ***no meningismus   RESP:  no increased work of breathing, ***clear to auscultation bilaterally CVS:   regular rate ***and rhythm Skin:   warm and dry, no rash on visible skin***    UC Treatments / Results  Labs (all labs ordered are listed, but only abnormal results are displayed) Labs Reviewed - No data to display  EKG   Radiology No results found.  Procedures Procedures (including critical care time)  Medications Ordered in UC Medications - No data to display  Initial Impression / Assessment and Plan / UC Course  I have reviewed the triage vital signs and the nursing notes.  Pertinent labs & imaging results that were available during my care of the patient were reviewed by me and considered in my medical decision making (see chart for details).       Pt is a 52 y.o. male who presents for *** days of respiratory symptoms. [place] is ***afebrile here without recent antipyretics. Satting well on room air. Overall pt is ***non-toxic appearing, well hydrated, without respiratory distress. Pulmonary exam ***is unremarkable.  COVID testing obtained ***and was negative. ***Pt to quarantine until COVID test results or longer if positive.  I will call patient with test results, if positive. History consistent with ***viral respiratory illness. Discussed symptomatic treatment.  Explained lack of efficacy of antibiotics in viral disease.  Typical duration of symptoms discussed.   Return and ED precautions given and voiced understanding. Discussed MDM, treatment plan and plan for follow-up with patient*** who agrees with plan.     Final Clinical Impressions(s) / UC Diagnoses   Final diagnoses:  None   Discharge Instructions   None    ED Prescriptions   None     PDMP not reviewed this encounter.

## 2022-09-07 NOTE — Discharge Instructions (Addendum)
Your chest xray did not show a pneumonia or bacterial bronchitis. You symptoms are likely caused by a virus. Use nasal spray to help with congestion.  Take steroids to see if this helps your cough and the fluid buildup in your ears. If cough persists for more than 3 week or is accompanied by fever, seek medical care.

## 2022-09-07 NOTE — ED Triage Notes (Signed)
Pt c/o cough & congestion x7 days. States he recently returned from a cruise on Sunday. Took covid test on Tuesday which was neg. Has tried tylenol,cold syrup & antihistamine w/o relief. Denies any sob or wheezing.

## 2022-12-19 ENCOUNTER — Ambulatory Visit: Payer: No Typology Code available for payment source | Admitting: Cardiology

## 2022-12-31 ENCOUNTER — Ambulatory Visit: Payer: No Typology Code available for payment source | Admitting: Cardiology

## 2023-05-07 ENCOUNTER — Other Ambulatory Visit: Payer: Self-pay | Admitting: Internal Medicine
